# Patient Record
Sex: Female | Born: 1955 | Race: Black or African American | Hispanic: No | Marital: Married | Smoking: Former smoker
Health system: Southern US, Community
[De-identification: ages and names within clinical notes are randomized; demographics above are authoritative.]

## PROBLEM LIST (undated history)

## (undated) DIAGNOSIS — R079 Chest pain, unspecified: Secondary | ICD-10-CM

## (undated) DIAGNOSIS — K219 Gastro-esophageal reflux disease without esophagitis: Secondary | ICD-10-CM

## (undated) DIAGNOSIS — M25559 Pain in unspecified hip: Secondary | ICD-10-CM

## (undated) DIAGNOSIS — K5900 Constipation, unspecified: Secondary | ICD-10-CM

## (undated) HISTORY — PX: OTHER SURGICAL HISTORY: SHX169

## (undated) HISTORY — DX: Constipation, unspecified: K59.00

## (undated) HISTORY — PX: TUMOR EXCISION: SHX421

## (undated) HISTORY — DX: Gastro-esophageal reflux disease without esophagitis: K21.9

## (undated) HISTORY — DX: Pain in unspecified hip: M25.559

## (undated) HISTORY — DX: Chest pain, unspecified: R07.9

---

## 2015-10-26 ENCOUNTER — Other Ambulatory Visit: Payer: Self-pay | Admitting: Internal Medicine

## 2015-10-26 ENCOUNTER — Ambulatory Visit
Admission: RE | Admit: 2015-10-26 | Discharge: 2015-10-26 | Disposition: A | Discharge status: Home / Self Care | Payer: BLUE CROSS/BLUE SHIELD | Source: Ambulatory Visit | Attending: Internal Medicine | Admitting: Internal Medicine

## 2015-10-26 DIAGNOSIS — Z800 Family history of malignant neoplasm of digestive organs: Secondary | ICD-10-CM | POA: Insufficient documentation

## 2015-10-26 DIAGNOSIS — K219 Gastro-esophageal reflux disease without esophagitis: Secondary | ICD-10-CM | POA: Insufficient documentation

## 2015-10-26 DIAGNOSIS — R0789 Other chest pain: Secondary | ICD-10-CM | POA: Insufficient documentation

## 2015-10-26 DIAGNOSIS — K5901 Slow transit constipation: Secondary | ICD-10-CM

## 2015-10-26 DIAGNOSIS — M25559 Pain in unspecified hip: Secondary | ICD-10-CM | POA: Insufficient documentation

## 2015-11-02 DIAGNOSIS — K5900 Constipation, unspecified: Secondary | ICD-10-CM | POA: Insufficient documentation

## 2015-11-04 ENCOUNTER — Ambulatory Visit (INDEPENDENT_AMBULATORY_CARE_PROVIDER_SITE_OTHER): Payer: BLUE CROSS/BLUE SHIELD | Admitting: Cardiology

## 2015-11-04 ENCOUNTER — Encounter: Payer: Self-pay | Admitting: Cardiology

## 2015-11-04 VITALS — BP 98/60 | HR 79 | Ht 64.0 in | Wt 140.4 lb

## 2015-11-04 DIAGNOSIS — K219 Gastro-esophageal reflux disease without esophagitis: Secondary | ICD-10-CM

## 2015-11-04 DIAGNOSIS — R0789 Other chest pain: Secondary | ICD-10-CM

## 2015-11-04 NOTE — Patient Instructions (Signed)
Medication Instructions:  Your physician recommends that you continue on your current medications as directed. Please refer to the Current Medication list given to you today.   Labwork: None  Testing/Procedures: Your physician has requested that you have a lexiscan myoview. For further information please visit [email or web address]. Please follow instruction sheet, as given.   Your physician has requested that you have an echocardiogram. Echocardiography is a painless test that uses sound waves to create images of your heart. It provides your doctor with information about the size and shape of your heart and how well your heart's chambers and valves are working. This procedure takes approximately one hour. There are no restrictions for this procedure.  Follow-Up: Your physician recommends that you schedule a follow-up appointment AS NEEDED with Dr. Turner pending study results.  Any Other Special Instructions Will Be Listed Below (If Applicable).     If you need a refill on your cardiac medications before your next appointment, please call your pharmacy.   

## 2015-11-04 NOTE — Progress Notes (Signed)
Cardiology Office Note   Date:  11/04/2015   ID:  Katie Beasley, DOB 27-Mar-1956, MRN [id number]  PCP:  Katy Apo, MD    No chief complaint on file.     History of Present Illness: Katie Beasley is a 59 y.o. female who presents for evaluation of chest pain.  This has been going on for about 3 months.  She was seen by her PCP for CP and EKG was normal as well as D-Dimer.  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  She describes it as a sharp cramp that goes through to her back and sometimes is a squeezing discomfort.  It usually lasts a few seconds and goes away but several times a day and then may go days without it.  She does get SOB with it.  She will get nauseated and diaphoretic sometimes with the pain.  She has also noticed some fluttering of her heart a few times weekly lasting a few seconds. 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   She also has been having constipation and can got 2 weeks without a BM.  She also has a history of GERD.  She was started on Nexium but still gets the pain.    Past Medical History  Diagnosis Date  . Chest pain   . Hip pain   . GERD (gastroesophageal reflux disease)   . Constipation     Past Surgical History  Procedure Laterality Date  . Tubal    . Tumor excision       Current Outpatient Prescriptions  Medication Sig Dispense Refill  . esomeprazole (NEXIUM) 20 MG capsule Take 20 mg by mouth daily at 12 noon.    . polyethylene glycol (MIRALAX / GLYCOLAX) packet Take 17 g by mouth daily as needed.     No current facility-administered medications for this visit.    Allergies:   Iodinated diagnostic agents; Sulfa antibiotics; and Sulfamethoxazole-trimethoprim    Social History:  The patient  reports that she quit smoking about 20 years ago. She does not have any smokeless tobacco history on file.   Family  History:  The patient's family history includes Arrhythmia (age of onset: 74) in an other family member; Asthma in her brother; Colon cancer in her brother and father; Dementia in her brother; Pancreatic cancer in her mother; Throat cancer in her brother.    ROS:  Please see the history of present illness.   Otherwise, review of systems are positive for none.   All other systems are reviewed and negative.    PHYSICAL EXAM: VS:  BP 98/60 mmHg  Pulse 79  Ht  (1.626 m)  Wt 140 lb 6.4 oz (63.685 kg)  BMI 24.09 kg/m2 , BMI Body mass index is 24.09 kg/(m^2). GEN: Well nourished, well developed, in no acute distress HEENT: normal Neck: no JVD, carotid bruits, or masses Cardiac: RRR; no murmurs, rubs, or gallops,no edema  Respiratory:  clear to auscultation bilaterally, normal work of breathing GI: soft, nontender, nondistended, + BS MS: no deformity or atrophy Skin: warm and dry, no rash Neuro:  Strength and sensation are intact Psych: euthymic mood, full affect   EKG:  EKG is ordered today. The ekg ordered today demonstrates NSR with nonspecific ST  abnormality   Recent Labs: No results found for requested labs within last 365 days.    Lipid Panel No results found for: CHOL, TRIG, HDL, CHOLHDL, VLDL, LDLCALC, LDLDIRECT    Wt Readings from Last 3 Encounters:  11/04/15 140 lb 6.4 oz (63.685 kg)       ASSESSMENT AND PLAN:  1.  Chest pain with typical and atypical components.  She has GERD and also does a lot of lifting with her job.  Her EKG is nonischemic.  Her only risk factors are post menopausal state and remote tobacco use. I will set her up for a 2D echo to assess LVF and Lexiscan myoview to assess for ischemia.  She has a broken toe and is in a boot so she cannot walk on a treadmill.   Current medicines are reviewed at length with the patient today.  The patient does not have concerns regarding medicines.  The following changes have been made:  no change  Labs/  tests ordered today: See above Assessment and Plan No orders of the defined types were placed in this encounter.     Disposition:   FU with me PRN pending results of studies SignedQuintella Reichert, Kashauna Celmer R, MD  11/04/2015 9:52 AM    [facility] [address], [place], [place]  [address] Phone: [phone]; Fax: [phone]

## 2015-11-11 ENCOUNTER — Telehealth (HOSPITAL_COMMUNITY): Payer: Self-pay | Admitting: *Deleted

## 2015-11-11 NOTE — Telephone Encounter (Signed)
Patient given detailed instructions per Myocardial Perfusion Study Information Sheet for the test on 11/16/15 at 945. Patient notified to arrive 15 minutes early and that it is imperative to arrive on time for appointment to keep from having the test rescheduled.  If you need to cancel or reschedule your appointment, please call the office within 24 hours of your appointment. Failure to do so may result in a cancellation of your appointment, and a $50 no show fee. Patient verbalized understanding.Alexy Heldt J Marko Skalski, RN  

## 2015-11-12 ENCOUNTER — Encounter (HOSPITAL_COMMUNITY): Payer: BLUE CROSS/BLUE SHIELD

## 2015-11-16 ENCOUNTER — Ambulatory Visit (HOSPITAL_COMMUNITY): Payer: BLUE CROSS/BLUE SHIELD | Attending: Cardiology

## 2015-11-16 ENCOUNTER — Other Ambulatory Visit: Payer: Self-pay

## 2015-11-16 ENCOUNTER — Ambulatory Visit (HOSPITAL_BASED_OUTPATIENT_CLINIC_OR_DEPARTMENT_OTHER): Payer: BLUE CROSS/BLUE SHIELD

## 2015-11-16 VITALS — Ht 64.0 in | Wt 140.0 lb

## 2015-11-16 DIAGNOSIS — R0789 Other chest pain: Secondary | ICD-10-CM

## 2015-11-16 DIAGNOSIS — I340 Nonrheumatic mitral (valve) insufficiency: Secondary | ICD-10-CM | POA: Diagnosis not present

## 2015-11-16 DIAGNOSIS — R002 Palpitations: Secondary | ICD-10-CM | POA: Insufficient documentation

## 2015-11-16 DIAGNOSIS — R0609 Other forms of dyspnea: Secondary | ICD-10-CM | POA: Diagnosis not present

## 2015-11-16 DIAGNOSIS — R0602 Shortness of breath: Secondary | ICD-10-CM | POA: Diagnosis not present

## 2015-11-16 DIAGNOSIS — R110 Nausea: Secondary | ICD-10-CM

## 2015-11-16 LAB — MYOCARDIAL PERFUSION IMAGING
CHL CUP RESTING HR STRESS: 61 {beats}/min
CSEPPHR: 123 {beats}/min
LHR: 0.26
LVDIAVOL: 81 mL
LVSYSVOL: 31 mL
NUC STRESS TID: 1.04
SDS: 0
SRS: 0
SSS: 0

## 2015-11-16 MED ORDER — TECHNETIUM TC 99M SESTAMIBI GENERIC - CARDIOLITE
32.7000 | Freq: Once | INTRAVENOUS | Status: AC | PRN
  Administered 2015-11-16: 32.7 via INTRAVENOUS

## 2015-11-16 MED ORDER — TECHNETIUM TC 99M SESTAMIBI GENERIC - CARDIOLITE
10.8000 | Freq: Once | INTRAVENOUS | Status: AC | PRN
  Administered 2015-11-16: 11 via INTRAVENOUS

## 2015-11-16 MED ORDER — REGADENOSON 0.4 MG/5ML IV SOLN
0.4000 mg | Freq: Once | INTRAVENOUS | Status: AC
  Administered 2015-11-16: 0.4 mg via INTRAVENOUS

## 2015-11-16 MED ORDER — AMINOPHYLLINE 25 MG/ML IV SOLN
75.0000 mg | Freq: Once | INTRAVENOUS | Status: AC
Start: 2015-11-16 — End: 2015-11-16
  Administered 2015-11-16: 75 mg via INTRAVENOUS

## 2015-11-18 ENCOUNTER — Encounter (HOSPITAL_COMMUNITY): Payer: BLUE CROSS/BLUE SHIELD

## 2015-11-18 ENCOUNTER — Other Ambulatory Visit (HOSPITAL_COMMUNITY): Payer: BLUE CROSS/BLUE SHIELD

## 2016-02-24 ENCOUNTER — Ambulatory Visit (INDEPENDENT_AMBULATORY_CARE_PROVIDER_SITE_OTHER): Payer: Commercial Managed Care - HMO | Admitting: Urology

## 2016-02-24 DIAGNOSIS — K5909 Other constipation: Secondary | ICD-10-CM | POA: Diagnosis not present

## 2016-02-24 DIAGNOSIS — N3281 Overactive bladder: Secondary | ICD-10-CM

## 2016-02-24 DIAGNOSIS — R32 Unspecified urinary incontinence: Secondary | ICD-10-CM | POA: Diagnosis not present

## 2016-10-05 IMAGING — CR DG ABDOMEN 1V
1 series · 1 of 1 positions shown · non-contrast
Comparison: None.

CLINICAL DATA: Chronic constipation .

EXAM:
ABDOMEN - 1 VIEW

[t abdomen supine]
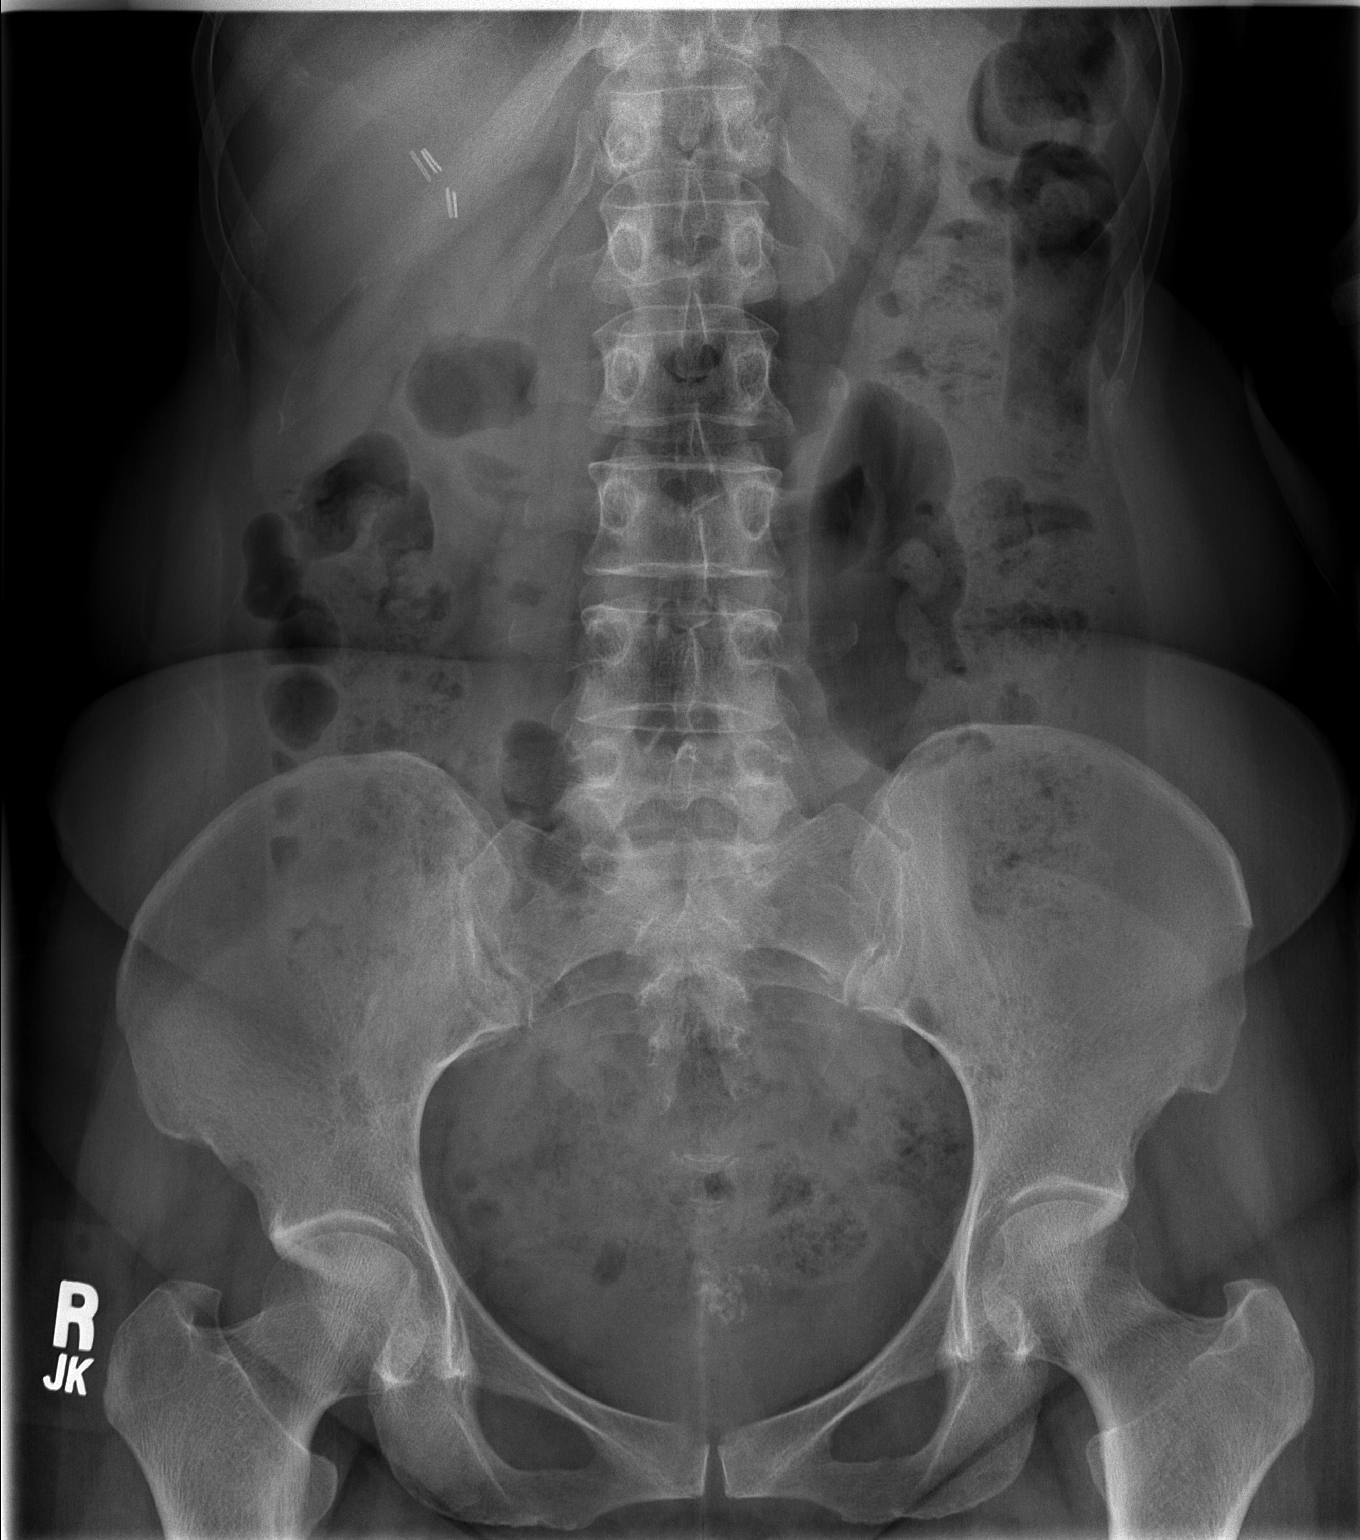

[1 of 1 positions shown; findings below may reference images not displayed]

FINDINGS: Soft tissue structures are unremarkable. Gas pattern is nonspecific.
Prominent amount of stool noted throughout the colon. No free air .
Surgical clips right upper quadrant . Calcified pelvic densities
noted consistent fibroids.
IMPRESSION: No acute abnormality. Prominent stool noted throughout the colon
consistent with constipation.
